# Patient Record
Sex: Female | Born: 1965 | Race: Black or African American | Hispanic: No | Marital: Single | Smoking: Never smoker
Health system: Southern US, Community
[De-identification: ages and names within clinical notes are randomized; demographics above are authoritative.]

## PROBLEM LIST (undated history)

## (undated) DIAGNOSIS — Z8619 Personal history of other infectious and parasitic diseases: Secondary | ICD-10-CM

## (undated) DIAGNOSIS — G4730 Sleep apnea, unspecified: Secondary | ICD-10-CM

## (undated) DIAGNOSIS — F3281 Premenstrual dysphoric disorder: Secondary | ICD-10-CM

## (undated) DIAGNOSIS — F329 Major depressive disorder, single episode, unspecified: Secondary | ICD-10-CM

## (undated) DIAGNOSIS — F32A Depression, unspecified: Secondary | ICD-10-CM

## (undated) HISTORY — PX: FOOT SURGERY: SHX648

## (undated) HISTORY — DX: Premenstrual dysphoric disorder: F32.81

## (undated) HISTORY — DX: Personal history of other infectious and parasitic diseases: Z86.19

## (undated) HISTORY — PX: WISDOM TOOTH EXTRACTION: SHX21

## (undated) HISTORY — DX: Major depressive disorder, single episode, unspecified: F32.9

## (undated) HISTORY — DX: Sleep apnea, unspecified: G47.30

## (undated) HISTORY — DX: Depression, unspecified: F32.A

---

## 1997-03-31 ENCOUNTER — Other Ambulatory Visit: Admission: RE | Admit: 1997-03-31 | Discharge: 1997-03-31 | Payer: Self-pay | Admitting: Obstetrics and Gynecology

## 1997-07-18 ENCOUNTER — Emergency Department (HOSPITAL_COMMUNITY): Admission: EM | Admit: 1997-07-18 | Discharge: 1997-07-18 | Payer: Self-pay | Admitting: Emergency Medicine

## 1998-04-20 ENCOUNTER — Other Ambulatory Visit: Admission: RE | Admit: 1998-04-20 | Discharge: 1998-04-20 | Payer: Self-pay | Admitting: Obstetrics and Gynecology

## 1999-06-01 ENCOUNTER — Other Ambulatory Visit: Admission: RE | Admit: 1999-06-01 | Discharge: 1999-06-01 | Payer: Self-pay | Admitting: Obstetrics & Gynecology

## 2000-06-04 ENCOUNTER — Other Ambulatory Visit: Admission: RE | Admit: 2000-06-04 | Discharge: 2000-06-04 | Payer: Self-pay | Admitting: Obstetrics and Gynecology

## 2001-08-26 ENCOUNTER — Other Ambulatory Visit: Admission: RE | Admit: 2001-08-26 | Discharge: 2001-08-26 | Payer: Self-pay | Admitting: Obstetrics and Gynecology

## 2001-09-14 ENCOUNTER — Encounter: Payer: Self-pay | Admitting: Obstetrics and Gynecology

## 2001-09-14 ENCOUNTER — Ambulatory Visit (HOSPITAL_COMMUNITY): Admission: RE | Admit: 2001-09-14 | Discharge: 2001-09-14 | Payer: Self-pay | Admitting: Obstetrics and Gynecology

## 2002-09-02 ENCOUNTER — Other Ambulatory Visit: Admission: RE | Admit: 2002-09-02 | Discharge: 2002-09-02 | Payer: Self-pay | Admitting: Obstetrics and Gynecology

## 2002-09-27 ENCOUNTER — Ambulatory Visit (HOSPITAL_COMMUNITY): Admission: RE | Admit: 2002-09-27 | Discharge: 2002-09-27 | Payer: Self-pay | Admitting: Obstetrics and Gynecology

## 2002-09-27 ENCOUNTER — Encounter: Payer: Self-pay | Admitting: Obstetrics and Gynecology

## 2004-05-21 ENCOUNTER — Other Ambulatory Visit: Admission: RE | Admit: 2004-05-21 | Discharge: 2004-05-21 | Payer: Self-pay | Admitting: Obstetrics and Gynecology

## 2005-05-23 ENCOUNTER — Other Ambulatory Visit: Admission: RE | Admit: 2005-05-23 | Discharge: 2005-05-23 | Payer: Self-pay | Admitting: Obstetrics and Gynecology

## 2005-09-13 ENCOUNTER — Other Ambulatory Visit: Admission: RE | Admit: 2005-09-13 | Discharge: 2005-09-13 | Payer: Self-pay | Admitting: Obstetrics and Gynecology

## 2010-01-28 ENCOUNTER — Encounter: Payer: Self-pay | Admitting: Obstetrics and Gynecology

## 2010-08-07 ENCOUNTER — Other Ambulatory Visit: Payer: Self-pay | Admitting: Obstetrics and Gynecology

## 2010-08-07 DIAGNOSIS — Z1231 Encounter for screening mammogram for malignant neoplasm of breast: Secondary | ICD-10-CM

## 2010-08-17 ENCOUNTER — Ambulatory Visit
Admission: RE | Admit: 2010-08-17 | Discharge: 2010-08-17 | Disposition: A | Discharge status: Home / Self Care | Payer: BC Managed Care – PPO | Source: Ambulatory Visit | Attending: Obstetrics and Gynecology | Admitting: Obstetrics and Gynecology

## 2010-08-17 DIAGNOSIS — Z1231 Encounter for screening mammogram for malignant neoplasm of breast: Secondary | ICD-10-CM

## 2011-05-23 ENCOUNTER — Encounter: Payer: Self-pay | Admitting: Obstetrics and Gynecology

## 2011-05-23 DIAGNOSIS — F3281 Premenstrual dysphoric disorder: Secondary | ICD-10-CM | POA: Insufficient documentation

## 2011-05-23 DIAGNOSIS — B379 Candidiasis, unspecified: Secondary | ICD-10-CM | POA: Insufficient documentation

## 2011-05-23 DIAGNOSIS — N943 Premenstrual tension syndrome: Secondary | ICD-10-CM | POA: Insufficient documentation

## 2011-05-24 ENCOUNTER — Ambulatory Visit (INDEPENDENT_AMBULATORY_CARE_PROVIDER_SITE_OTHER): Payer: BC Managed Care – PPO | Admitting: Obstetrics and Gynecology

## 2011-05-24 ENCOUNTER — Encounter: Payer: Self-pay | Admitting: Obstetrics and Gynecology

## 2011-05-24 VITALS — BP 110/72 | Temp 98.7°F | Ht 65.0 in | Wt 187.0 lb

## 2011-05-24 DIAGNOSIS — IMO0001 Reserved for inherently not codable concepts without codable children: Secondary | ICD-10-CM

## 2011-05-24 DIAGNOSIS — R102 Pelvic and perineal pain: Secondary | ICD-10-CM

## 2011-05-24 DIAGNOSIS — Z309 Encounter for contraceptive management, unspecified: Secondary | ICD-10-CM

## 2011-05-24 DIAGNOSIS — N949 Unspecified condition associated with female genital organs and menstrual cycle: Secondary | ICD-10-CM

## 2011-05-24 LAB — POCT URINALYSIS DIPSTICK
Bilirubin, UA: NEGATIVE
Ketones, UA: NEGATIVE
Leukocytes, UA: NEGATIVE
Protein, UA: NEGATIVE
Spec Grav, UA: <=1.005

## 2011-05-24 MED ORDER — NORGESTIMATE-ETH ESTRADIOL 0.25-35 MG-MCG PO TABS: 1.0000 | ORAL_TABLET | Freq: Every day | ORAL | Status: DC

## 2011-05-24 NOTE — Progress Notes (Signed)
46 YO complaining of dull nagging pelvic  pain that becomes intense at times even without her period.  Intense times pain is rated  5/10 but relieved with Ibuprofen 600mg  .  This has occurred several times a week for the past x several months. With her  period, patient's pain is a  7/10 but also responsive to Ibuprofen.  Menses lasts 3 days, is very light and on occasion will skip a period.  O: Pelvic:  EGBUS-wnl, vagina-scant pink discharge, cervix-no lesions, uterus-retroverted, tender on left but no palpable masses, adnexae-no masses but left tenderness  UPT-negative U/A-negative  A: New Pelvic Pain  P: Sprintec #1 1 po qd 11 rf begin with next cycle       Reviewed causes of pelvic pain: GI. GU, surgery &     musculoskeletal      Pelvic U/S-r/o pelvic masses  Annarae Macnair, PA-C

## 2011-05-24 NOTE — Progress Notes (Signed)
Vaginal Discharge: no Kidney Stones: no Prior Eval: no  Odor: no Constipation: no Prior U/S: no  Fever: no Diarrhea: no Hx of Ovarian Cyst: yes  Irreg. Periods: no Rectal Bleeding: no Hx of STD-PID: yes  Dyspareunia: no Vomiting: no Appendectomy: no   Dysuria: no Nausea: no Gall Bladder Ds: no  Frequency: no Pregnant: no Other: PT WOULD LIKE A REFILL ON BC; PT WILL LOSE HER JOB BEFORE AEX  Urgency: no Fibroids: no   Hematuria: no Endometriosis: no

## 2011-06-04 ENCOUNTER — Ambulatory Visit (INDEPENDENT_AMBULATORY_CARE_PROVIDER_SITE_OTHER): Payer: BC Managed Care – PPO

## 2011-06-04 ENCOUNTER — Encounter: Payer: Self-pay | Admitting: Obstetrics and Gynecology

## 2011-06-04 ENCOUNTER — Ambulatory Visit (INDEPENDENT_AMBULATORY_CARE_PROVIDER_SITE_OTHER): Payer: BC Managed Care – PPO | Admitting: Obstetrics and Gynecology

## 2011-06-04 ENCOUNTER — Other Ambulatory Visit: Payer: Self-pay | Admitting: Obstetrics and Gynecology

## 2011-06-04 VITALS — BP 104/70 | HR 70 | Ht 65.0 in | Wt 188.0 lb

## 2011-06-04 DIAGNOSIS — F329 Major depressive disorder, single episode, unspecified: Secondary | ICD-10-CM | POA: Insufficient documentation

## 2011-06-04 DIAGNOSIS — R102 Pelvic and perineal pain: Secondary | ICD-10-CM

## 2011-06-04 DIAGNOSIS — Z8619 Personal history of other infectious and parasitic diseases: Secondary | ICD-10-CM | POA: Insufficient documentation

## 2011-06-04 DIAGNOSIS — N949 Unspecified condition associated with female genital organs and menstrual cycle: Secondary | ICD-10-CM

## 2011-06-04 DIAGNOSIS — G4730 Sleep apnea, unspecified: Secondary | ICD-10-CM

## 2011-06-04 DIAGNOSIS — N83209 Unspecified ovarian cyst, unspecified side: Secondary | ICD-10-CM

## 2011-06-04 NOTE — Progress Notes (Signed)
46 YO seen last week with several month history of pelvic pain that is intense but relieved with Ibuprofen 600 mg.  Occurs with and without menses.   O: U/S uterus-wnl;  # 3 fibroids < 2 cm; normal appearing right ovary;  left ovary without well defined borders and fills the adnexa; questionable thickened tube adjacent to left ovary vs complex ovarian tissue   A: Pelvic Pain     Left Adnexal Mass  P: Repeat U/S in 5 weeks (patient's insurance runs out)      Call for any increase in symptoms-torsion precautions      Continue Ibuprofen as directed with  food for pain      U/S to Dr. Normand Sloop for review and any further recommenda-     tions  Elanda Garmany, PA-C

## 2011-06-28 ENCOUNTER — Other Ambulatory Visit: Payer: Self-pay | Admitting: Obstetrics and Gynecology

## 2011-06-28 DIAGNOSIS — D219 Benign neoplasm of connective and other soft tissue, unspecified: Secondary | ICD-10-CM

## 2011-06-28 DIAGNOSIS — R102 Pelvic and perineal pain: Secondary | ICD-10-CM

## 2011-07-01 ENCOUNTER — Other Ambulatory Visit: Payer: Self-pay | Admitting: Obstetrics and Gynecology

## 2011-07-01 ENCOUNTER — Encounter: Payer: Self-pay | Admitting: Obstetrics and Gynecology

## 2011-07-01 ENCOUNTER — Ambulatory Visit (INDEPENDENT_AMBULATORY_CARE_PROVIDER_SITE_OTHER): Payer: BC Managed Care – PPO | Admitting: Obstetrics and Gynecology

## 2011-07-01 ENCOUNTER — Ambulatory Visit (INDEPENDENT_AMBULATORY_CARE_PROVIDER_SITE_OTHER): Payer: BC Managed Care – PPO

## 2011-07-01 VITALS — BP 112/64 | Temp 99.0°F | Ht 65.0 in | Wt 185.0 lb

## 2011-07-01 DIAGNOSIS — N949 Unspecified condition associated with female genital organs and menstrual cycle: Secondary | ICD-10-CM

## 2011-07-01 DIAGNOSIS — R102 Pelvic and perineal pain: Secondary | ICD-10-CM

## 2011-07-01 DIAGNOSIS — D259 Leiomyoma of uterus, unspecified: Secondary | ICD-10-CM

## 2011-07-01 DIAGNOSIS — D219 Benign neoplasm of connective and other soft tissue, unspecified: Secondary | ICD-10-CM

## 2011-07-01 DIAGNOSIS — N7011 Chronic salpingitis: Secondary | ICD-10-CM

## 2011-07-01 DIAGNOSIS — N7013 Chronic salpingitis and oophoritis: Secondary | ICD-10-CM

## 2011-07-01 NOTE — Progress Notes (Addendum)
46 YO with pelvic pain and previous ultrasound with a left adnexal mass (? left hydrosalpinx)  returns for repeat ultrasound. Aware of discomfort daily but during menses it becomes actuals pain rated at  a 4/10 and with intercourse (non-positional) will increase to 2/10 and linger x 1 day. Last menstrual period was 06/18/11.  Flow is only for 3 days, is very light and has cramps as described above.  This problem has been noticed for the past 8 months.   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 Estanislado Pandy recommendations for further     evaluation     Patient's job and insurance will end July 1st and if possible    would like to wait until she's employed with benefits again    if an invasive procedure is recommended.     RTO: TBA or prn  Kristy Brindle, PA-C

## 2011-07-01 NOTE — Patient Instructions (Signed)
hydrosalpinx

## 2011-07-01 NOTE — Progress Notes (Signed)
Vag. Discharge:no Odor:no Fever:no Irreg.Periods:yes Dyspareunia:yes Dysuria:no Frequency:no Urgency:no Hematuria:no Kidney stones:no Constipation:no Diarrhea:no Rectal Bleeding: no Vomiting:no Nausea:no Pregnant:no Fibroids:yes Endometriosis:no Hx of Ovarian Cyst:yes Hx IUD:no Hx STD-PID:yes Appendectomy:no Gall Bladder Dz:no

## 2013-06-28 ENCOUNTER — Other Ambulatory Visit: Payer: Self-pay | Admitting: Obstetrics and Gynecology

## 2013-06-28 DIAGNOSIS — Z1231 Encounter for screening mammogram for malignant neoplasm of breast: Secondary | ICD-10-CM

## 2013-07-23 ENCOUNTER — Ambulatory Visit
Admission: RE | Admit: 2013-07-23 | Discharge: 2013-07-23 | Disposition: A | Discharge status: Home / Self Care | Payer: 59 | Source: Ambulatory Visit | Attending: Obstetrics and Gynecology | Admitting: Obstetrics and Gynecology

## 2013-07-23 ENCOUNTER — Encounter (INDEPENDENT_AMBULATORY_CARE_PROVIDER_SITE_OTHER): Payer: Self-pay

## 2013-07-23 DIAGNOSIS — Z1231 Encounter for screening mammogram for malignant neoplasm of breast: Secondary | ICD-10-CM

## 2013-11-08 ENCOUNTER — Encounter: Payer: Self-pay | Admitting: Obstetrics and Gynecology

## 2014-06-20 ENCOUNTER — Ambulatory Visit
Admission: RE | Admit: 2014-06-20 | Discharge: 2014-06-20 | Disposition: A | Discharge status: Home / Self Care | Payer: 59 | Source: Ambulatory Visit | Attending: Family Medicine | Admitting: Family Medicine

## 2014-06-20 ENCOUNTER — Other Ambulatory Visit: Payer: Self-pay | Admitting: Family Medicine

## 2014-06-20 DIAGNOSIS — R059 Cough, unspecified: Secondary | ICD-10-CM

## 2014-06-20 DIAGNOSIS — R05 Cough: Secondary | ICD-10-CM

## 2015-12-22 ENCOUNTER — Ambulatory Visit (INDEPENDENT_AMBULATORY_CARE_PROVIDER_SITE_OTHER): Payer: 59

## 2015-12-22 ENCOUNTER — Ambulatory Visit (INDEPENDENT_AMBULATORY_CARE_PROVIDER_SITE_OTHER): Payer: 59 | Admitting: Sports Medicine

## 2015-12-22 ENCOUNTER — Encounter: Payer: Self-pay | Admitting: Sports Medicine

## 2015-12-22 DIAGNOSIS — M79672 Pain in left foot: Secondary | ICD-10-CM | POA: Diagnosis not present

## 2015-12-22 DIAGNOSIS — M79671 Pain in right foot: Secondary | ICD-10-CM | POA: Diagnosis not present

## 2015-12-22 DIAGNOSIS — M722 Plantar fascial fibromatosis: Secondary | ICD-10-CM

## 2015-12-22 MED ORDER — TRIAMCINOLONE ACETONIDE 10 MG/ML IJ SUSP: 10.0000 mg | Freq: Once | INTRAMUSCULAR | Status: AC

## 2015-12-22 MED ORDER — METHYLPREDNISOLONE 4 MG PO TBPK
ORAL_TABLET | ORAL | 0 refills | Status: DC
Start: 2015-12-22 — End: 2018-06-24

## 2015-12-22 MED ORDER — DICLOFENAC SODIUM 75 MG PO TBEC: 75.0000 mg | DELAYED_RELEASE_TABLET | Freq: Two times a day (BID) | ORAL | 0 refills | Status: DC

## 2015-12-22 NOTE — Progress Notes (Signed)
Subjective: Kristy Fernandez is a 50 y.o. female patient presents to office with complaint of heel pain on the right greater than left. 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 Patient has treated this problem with orthotics, stretching, icing, changing shoes, however, her pain exacerbated by work, works as a Scientist, product/process development. Denies any other pedal complaints.   Patient Active Problem List   Diagnosis Date Noted  . 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 History of HPV infection   . Depression   . Observed sleep apnea   . Yeast infection 05/23/2011  . PMS (premenstrual syndrome) 05/23/2011  . PMDD (premenstrual dysphoric disorder) 05/23/2011    Current Outpatient Prescriptions on File Prior to Visit  Medication Sig Dispense Refill  . citalopram (CELEXA) 20 MG tablet Take 20 mg by mouth daily.    . norethindrone-ethinyl estradiol (NECON,BREVICON,MODICON) 0.5-35 MG-MCG tablet Take 1 tablet by mouth daily.    . norgestimate-ethinyl estradiol (SPRINTEC 28) 0.25-35 MG-MCG tablet Take 1 tablet by mouth daily. 1 Package 11  . temazepam (RESTORIL) 15 MG capsule Take 15 mg by mouth at bedtime as needed.    . zolpidem (AMBIEN) 10 MG tablet Take 10 mg by mouth at bedtime as needed.     No current facility-administered medications on file prior to visit.     Allergies  Allergen Reactions  . Floxin [Ofloxacin] Itching and Rash  . Shellfish Allergy Itching and Shortness Of Breath  . Shellfish-Derived Products     Objective: Physical Exam General: The patient is alert and oriented x3 in no acute distress.  Dermatology: Skin is warm, dry and supple bilateral lower extremities. Nails 1-10 are normal. There is no erythema, edema, no eccymosis, no open lesions present. Integument is otherwise unremarkable.  Vascular: Dorsalis Pedis pulse and Posterior Tibial pulse are 2/4 bilateral. Capillary fill time is immediate to all digits.  Neurological:  Grossly intact to light touch with an achilles reflex of +2/5 and a negative Tinel's sign bilateral.  Musculoskeletal: Tenderness to palpation at the medial calcaneal tubercale and through the insertion of the plantar fascia on the left and right foot. No pain with compression of calcaneus bilateral. No pain with tuning fork to calcaneus bilateral. No pain with calf compression bilateral. There is decreased Ankle joint range of motion bilateral. All other joints range of motion within normal limits bilateral. Strength 5/5 in all groups bilateral. Pes planus foot type.  Xray, Right/Left foot:  Normal osseous mineralization. Joint spaces preserved except at midtarsal joint where there is breached, suggestive of pes planus. No fracture/dislocation/boney destruction. Calcaneal spur present with moderate thickening of plantar fascia. No other soft tissue abnormalities or radiopaque foreign bodies.   Assessment and Plan: Problem List Items Addressed This Visit    None    Visit Diagnoses    Plantar fasciitis    -  Primary   Relevant Medications   triamcinolone acetonide (KENALOG) 10 MG/ML injection 10 mg   methylPREDNISolone (MEDROL DOSEPAK) 4 MG TBPK tablet   diclofenac (VOLTAREN) 75 MG EC tablet   Pain in both feet       Relevant Medications   triamcinolone acetonide (KENALOG) 10 MG/ML injection 10 mg   methylPREDNISolone (MEDROL DOSEPAK) 4 MG TBPK tablet   diclofenac (VOLTAREN) 75 MG EC tablet   Other Relevant Orders   DG Foot 2 Views Left   DG Foot 2 Views Right      -Complete examination performed.  -Xrays reviewed -Discussed with  patient in detail the condition of plantar fasciitis, how this occurs and general treatment options. Explained both conservative and surgical treatments.  -After oral consent and aseptic prep, injected a mixture containing 1 ml of 2% plain lidocaine, 1 ml 0.5% plain marcaine, 0.5 ml of kenalog 10 and 0.5 ml of dexamethasone phosphate into left and right heel.  Post-injection care discussed with patient.  -Rx Diclofenac to start after Medrol dose pack is completed -Recommended good supportive shoes and advised use of custom molded orthoses. - Explained in detail the use of the fascial braces which were dispensed at today's visit. -Explained and dispensed to patient daily stretching exercises. -Recommend patient to ice affected area 1-2x daily. -Patient to return to office in 4 weeks for follow up or sooner if problems or questions arise.  Landis Martins, DPM

## 2015-12-22 NOTE — Patient Instructions (Signed)

## 2016-01-12 ENCOUNTER — Encounter: Payer: Self-pay | Admitting: Sports Medicine

## 2016-01-12 ENCOUNTER — Ambulatory Visit (INDEPENDENT_AMBULATORY_CARE_PROVIDER_SITE_OTHER): Payer: 59 | Admitting: Sports Medicine

## 2016-01-12 DIAGNOSIS — M722 Plantar fascial fibromatosis: Secondary | ICD-10-CM

## 2016-01-12 DIAGNOSIS — M79671 Pain in right foot: Secondary | ICD-10-CM

## 2016-01-12 DIAGNOSIS — M79672 Pain in left foot: Secondary | ICD-10-CM

## 2016-01-12 MED ORDER — DICLOFENAC SODIUM 75 MG PO TBEC: 75.0000 mg | DELAYED_RELEASE_TABLET | Freq: Two times a day (BID) | ORAL | 0 refills | Status: DC

## 2016-01-12 MED ORDER — TRIAMCINOLONE ACETONIDE 40 MG/ML IJ SUSP: 20.0000 mg | Freq: Once | INTRAMUSCULAR | Status: AC

## 2016-01-12 NOTE — Progress Notes (Signed)
Subjective: Kristy Fernandez is a 51 y.o. female returns to office for follow up evaluation after Left and Right heel injection for plantar fasciitis, injection #1 administered 4 weeks ago. Patient states that the injection seems to help her pain; now can walk and stand a little longer and do more exercise. However, at work, after about 8 hours of work, Her feet start to bother her. States that she has new work shoes which has helped some as well. Patient denies any recent changes in medications or new problems since last visit.   Patient Active Problem List   Diagnosis Date Noted  . 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 History of HPV infection   . Depression   . Observed sleep apnea   . Yeast infection 05/23/2011  . PMS (premenstrual syndrome) 05/23/2011  . PMDD (premenstrual dysphoric disorder) 05/23/2011    Current Outpatient Prescriptions on File Prior to Visit  Medication Sig Dispense Refill  . Albuterol Sulfate 108 (90 Base) MCG/ACT AEPB Inhale into the lungs.    Marland Kitchen amoxicillin (AMOXIL) 875 MG tablet     . citalopram (CELEXA) 20 MG tablet Take 20 mg by mouth daily.    . diclofenac (VOLTAREN) 75 MG EC tablet Take 1 tablet (75 mg total) by mouth 2 (two) times daily. 30 tablet 0  . fexofenadine (ALLEGRA) 180 MG tablet Take by mouth.    . fluticasone (FLONASE) 50 MCG/ACT nasal spray Place into the nose.    Marland Kitchen HYDROcodone-homatropine (HYCODAN) 5-1.5 MG/5ML syrup     . levonorgestrel-ethinyl estradiol (AVIANE,ALESSE,LESSINA) 0.1-20 MG-MCG tablet     . methylPREDNISolone (MEDROL DOSEPAK) 4 MG TBPK tablet Take as instructed 21 tablet 0  . MICROGESTIN FE 1.5/30 1.5-30 MG-MCG tablet     . norethindrone-ethinyl estradiol (NECON,BREVICON,MODICON) 0.5-35 MG-MCG tablet Take 1 tablet by mouth daily.    . norgestimate-ethinyl estradiol (SPRINTEC 28) 0.25-35 MG-MCG tablet Take 1 tablet by mouth daily. 1 Package 11  . temazepam (RESTORIL) 15 MG capsule Take 15 mg by mouth at bedtime as needed.    . zolpidem  (AMBIEN) 10 MG tablet Take 10 mg by mouth at bedtime as needed.     Current Facility-Administered Medications on File Prior to Visit  Medication Dose Route Frequency Provider Last Rate Last Dose  . triamcinolone acetonide (KENALOG) 10 MG/ML injection 10 mg  10 mg Other Once Landis Martins, DPM        Allergies  Allergen Reactions  . Floxin [Ofloxacin] Itching and Rash  . Shellfish Allergy Itching and Shortness Of Breath  . Shellfish-Derived Products     Objective:   General:  Alert and oriented x 3, in no acute distress  Dermatology: Skin is warm, dry, and supple bilateral. Nails are within normal limits. There is no lower extremity erythema, no eccymosis, no open lesions present bilateral.   Vascular: Dorsalis Pedis and Posterior Tibial pedal pulses are 2/4 bilateral. + hair growth noted bilateral. Capillary Fill Time is 3 seconds in all digits. No varicosities, No edema bilateral lower extremities.   Neurological: Sensation grossly intact to light touch with an achilles reflex of +2 and a negative Tinel's sign bilateral. Vibratory, sharp/dull, Semmes Weinstein Monofilament within normal limits.   Musculoskeletal: There is decreased tenderness to palpation at the medial calcaneal tubercale and through the insertion of the plantar fascia on the Left<right foot. No pain with compression to calcaneus or application of tuning fork. There is decreased Ankle joint range of motion bilateral. All other jointsrange of motion  within normal limits bilateral.  Strength 5/5 bilateral. Pes planus foot type bilateral.  Assessment and Plan: Problem List Items Addressed This Visit    None    Visit Diagnoses    Plantar fasciitis    -  Primary   Relevant Medications   diclofenac (VOLTAREN) 75 MG EC tablet   triamcinolone acetonide (KENALOG-40) injection 20 mg   Pain in both feet       Relevant Medications   diclofenac (VOLTAREN) 75 MG EC tablet   triamcinolone acetonide (KENALOG-40) injection 20  mg     -Complete examination performed.  -Previous x-rays reviewed. -Discussed with patient in detail the condition of plantar fasciitis, how this  occurs related to the foot type of the patient and general treatment options. - Patient opted for another injection today; After oral consent and aseptic prep, injected a mixture containing 1 ml of 1%plain lidocaine, 1 ml 0.5% plain marcaine, 0.5 ml of kenalog 40 and 0.5 ml of dexmethasone phosphate to left<right heel at area of most pain/trigger point injection. -Refilled diclofenac -Continue with plantar fascial braces, bilateral, stretching, icing, good supportive shoes, inserts daily.  -Discussed long term care and reocurrence; will closely monitor; if fails to improve will consider other treatment modalities.  -Patient to return to office in 1 month for follow up or sooner if problems or questions arise.  Landis Martins, DPM

## 2016-02-02 ENCOUNTER — Encounter: Payer: Self-pay | Admitting: Sports Medicine

## 2016-02-02 ENCOUNTER — Ambulatory Visit (INDEPENDENT_AMBULATORY_CARE_PROVIDER_SITE_OTHER): Payer: 59 | Admitting: Sports Medicine

## 2016-02-02 DIAGNOSIS — M79672 Pain in left foot: Secondary | ICD-10-CM

## 2016-02-02 DIAGNOSIS — M79671 Pain in right foot: Secondary | ICD-10-CM

## 2016-02-02 DIAGNOSIS — M722 Plantar fascial fibromatosis: Secondary | ICD-10-CM | POA: Diagnosis not present

## 2016-02-02 MED ORDER — TRIAMCINOLONE ACETONIDE 40 MG/ML IJ SUSP: 20.0000 mg | Freq: Once | INTRAMUSCULAR | Status: AC

## 2016-02-02 NOTE — Progress Notes (Signed)
Subjective: Kristy Fernandez is a 51 y.o. female returns to office for follow up evaluation after Left and Right heel injection for plantar fasciitis, injection #2 administered 4 weeks ago. Patient states that the injection seems to help her pain, however, still after about 8 hours of work, Her feet start to bother her. States that she feels like she is walking on the bone spur. Patient denies any recent changes in medications or new problems since last visit.   Patient Active Problem List   Diagnosis Date Noted  . 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 History of HPV infection   . Depression   . Observed sleep apnea   . Yeast infection 05/23/2011  . PMS (premenstrual syndrome) 05/23/2011  . PMDD (premenstrual dysphoric disorder) 05/23/2011    Current Outpatient Prescriptions on File Prior to Visit  Medication Sig Dispense Refill  . Albuterol Sulfate 108 (90 Base) MCG/ACT AEPB Inhale into the lungs.    Marland Kitchen amoxicillin (AMOXIL) 875 MG tablet     . citalopram (CELEXA) 20 MG tablet Take 20 mg by mouth daily.    . diclofenac (VOLTAREN) 75 MG EC tablet Take 1 tablet (75 mg total) by mouth 2 (two) times daily. 60 tablet 0  . fexofenadine (ALLEGRA) 180 MG tablet Take by mouth.    . fluticasone (FLONASE) 50 MCG/ACT nasal spray Place into the nose.    Marland Kitchen HYDROcodone-homatropine (HYCODAN) 5-1.5 MG/5ML syrup     . levonorgestrel-ethinyl estradiol (AVIANE,ALESSE,LESSINA) 0.1-20 MG-MCG tablet     . methylPREDNISolone (MEDROL DOSEPAK) 4 MG TBPK tablet Take as instructed 21 tablet 0  . MICROGESTIN FE 1.5/30 1.5-30 MG-MCG tablet     . norethindrone-ethinyl estradiol (NECON,BREVICON,MODICON) 0.5-35 MG-MCG tablet Take 1 tablet by mouth daily.    . norgestimate-ethinyl estradiol (SPRINTEC 28) 0.25-35 MG-MCG tablet Take 1 tablet by mouth daily. 1 Package 11  . temazepam (RESTORIL) 15 MG capsule Take 15 mg by mouth at bedtime as needed.    . zolpidem (AMBIEN) 10 MG tablet Take 10 mg by mouth at bedtime as needed.      Current Facility-Administered Medications on File Prior to Visit  Medication Dose Route Frequency Provider Last Rate Last Dose  . triamcinolone acetonide (KENALOG) 10 MG/ML injection 10 mg  10 mg Other Once [employer], DPM      . triamcinolone acetonide (KENALOG-40) injection 20 mg  20 mg Other Once Landis Martins, DPM        Allergies  Allergen Reactions  . Floxin [Ofloxacin] Itching and Rash  . Shellfish Allergy Itching and Shortness Of Breath  . Shellfish-Derived Products     Objective:   General:  Alert and oriented x 3, in no acute distress  Dermatology: Skin is warm, dry, and supple bilateral. Nails are within normal limits. There is no lower extremity erythema, no eccymosis, no open lesions present bilateral.   Vascular: Dorsalis Pedis and Posterior Tibial pedal pulses are 2/4 bilateral. + hair growth noted bilateral. Capillary Fill Time is 3 seconds in all digits. No varicosities, No edema bilateral lower extremities.   Neurological: Sensation grossly intact to light touch with an achilles reflex of +2 and a negative Tinel's sign bilateral. Vibratory, sharp/dull, Semmes Weinstein Monofilament within normal limits.   Musculoskeletal: There is decreased tenderness to palpation at the medial calcaneal tubercale and through the insertion of the plantar fascia on the Left<right foot. No pain with compression to calcaneus or application of tuning fork. There is decreased Ankle joint range of motion bilateral. All other jointsrange of  motion  within normal limits bilateral. Strength 5/5 bilateral. Pes planus foot type bilateral.  Assessment and Plan: Problem List Items Addressed This Visit    None    Visit Diagnoses    Plantar fasciitis    -  Primary   Relevant Medications   triamcinolone acetonide (KENALOG-40) injection 20 mg   Pain in both feet       Relevant Medications   triamcinolone acetonide (KENALOG-40) injection 20 mg     -Complete examination performed.   -Previous x-rays reviewed. -Discussed with patient in detail the condition of plantar fasciitis, how this occurs related to the foot type of the patient and general treatment options. -Patient declined physical therapy. States that she does not want to pay out of pocket for this treatment. Right now, May consider in the future. - Patient opted for another injection today; After oral consent and aseptic prep, injected a mixture containing 1 ml of 1%plain lidocaine, 1 ml 0.5% plain marcaine, 0.5 ml of kenalog 40 and 0.5 ml of dexmethasone phosphate to left<right heel at area of most pain/trigger point injection. This is injection #3. -Continue with diclofenac until completed. -Continue with plantar fascial braces, bilateral, stretching, icing, good supportive shoes, inserts daily. Encouraged patient to be compliant with icing and stretching as previously instructed. -Discussed long term care and reocurrence; will closely monitor; if fails to improve will consider other treatment modalities.  -Patient to return to office as needed for follow up or sooner if problems or questions arise.  Landis Martins, DPM

## 2016-02-16 ENCOUNTER — Ambulatory Visit: Payer: 59 | Admitting: Sports Medicine

## 2016-06-21 DIAGNOSIS — L669 Cicatricial alopecia, unspecified: Secondary | ICD-10-CM | POA: Insufficient documentation

## 2016-08-14 ENCOUNTER — Ambulatory Visit (INDEPENDENT_AMBULATORY_CARE_PROVIDER_SITE_OTHER): Payer: 59 | Admitting: Sports Medicine

## 2016-08-14 DIAGNOSIS — M79672 Pain in left foot: Secondary | ICD-10-CM

## 2016-08-14 DIAGNOSIS — M79671 Pain in right foot: Secondary | ICD-10-CM

## 2016-08-14 DIAGNOSIS — M722 Plantar fascial fibromatosis: Secondary | ICD-10-CM

## 2016-08-14 MED ORDER — TRIAMCINOLONE ACETONIDE 10 MG/ML IJ SUSP: 10.0000 mg | Freq: Once | INTRAMUSCULAR | Status: AC

## 2016-08-14 MED ORDER — DICLOFENAC SODIUM 75 MG PO TBEC: 75.0000 mg | DELAYED_RELEASE_TABLET | Freq: Two times a day (BID) | ORAL | 0 refills | Status: DC

## 2016-08-14 NOTE — Progress Notes (Signed)
Subjective: Kristy Fernandez is a 51 y.o. female returns to office for follow up evaluation of Left and Right heel pain reports that she has been doing good but about 2 weeks ago started to have pain again especially with trying to work and workout. States that she wants a medication refill and injections. Patient denies any recent changes in medications or new problems since last visit.   Patient Active Problem List   Diagnosis Date Noted  . 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 History of HPV infection   . Depression   . Observed sleep apnea   . Yeast infection 05/23/2011  . PMS (premenstrual syndrome) 05/23/2011  . PMDD (premenstrual dysphoric disorder) 05/23/2011    Current Outpatient Prescriptions on File Prior to Visit  Medication Sig Dispense Refill  . Albuterol Sulfate 108 (90 Base) MCG/ACT AEPB Inhale into the lungs.    Marland Kitchen amoxicillin (AMOXIL) 875 MG tablet     . citalopram (CELEXA) 20 MG tablet Take 20 mg by mouth daily.    . fexofenadine (ALLEGRA) 180 MG tablet Take by mouth.    . fluticasone (FLONASE) 50 MCG/ACT nasal spray Place into the nose.    Marland Kitchen HYDROcodone-homatropine (HYCODAN) 5-1.5 MG/5ML syrup     . levonorgestrel-ethinyl estradiol (AVIANE,ALESSE,LESSINA) 0.1-20 MG-MCG tablet     . methylPREDNISolone (MEDROL DOSEPAK) 4 MG TBPK tablet Take as instructed 21 tablet 0  . MICROGESTIN FE 1.5/30 1.5-30 MG-MCG tablet     . norethindrone-ethinyl estradiol (NECON,BREVICON,MODICON) 0.5-35 MG-MCG tablet Take 1 tablet by mouth daily.    . norgestimate-ethinyl estradiol (SPRINTEC 28) 0.25-35 MG-MCG tablet Take 1 tablet by mouth daily. 1 Package 11  . temazepam (RESTORIL) 15 MG capsule Take 15 mg by mouth at bedtime as needed.    . zolpidem (AMBIEN) 10 MG tablet Take 10 mg by mouth at bedtime as needed.     Current Facility-Administered Medications on File Prior to Visit  Medication Dose Route Frequency Provider Last Rate Last Dose  . triamcinolone acetonide (KENALOG) 10 MG/ML injection 10 mg   10 mg Other Once [place], Evellyn Tuff, DPM      . triamcinolone acetonide (KENALOG-40) injection 20 mg  20 mg Other Once [place], Khaleb Broz, DPM      . triamcinolone acetonide (KENALOG-40) injection 20 mg  20 mg Other Once Landis Martins, DPM        Allergies  Allergen Reactions  . Floxin [Ofloxacin] Itching and Rash  . Shellfish Allergy Itching and Shortness Of Breath  . Shellfish-Derived Products     Objective:   General:  Alert and oriented x 3, in no acute distress  Dermatology: Skin is warm, dry, and supple bilateral. Nails are within normal limits. There is no lower extremity erythema, no eccymosis, no open lesions present bilateral.   Vascular: Dorsalis Pedis and Posterior Tibial pedal pulses are 2/4 bilateral. + hair growth noted bilateral. Capillary Fill Time is 3 seconds in all digits. No varicosities, No edema bilateral lower extremities.   Neurological: Sensation grossly intact to light touch with an achilles reflex of +2 and a negative Tinel's sign bilateral. Vibratory, sharp/dull, Semmes Weinstein Monofilament within normal limits.   Musculoskeletal: There is decreased tenderness to palpation at the medial calcaneal tubercale and through the insertion of the plantar fascia on the Left<right foot. No pain with compression to calcaneus or application of tuning fork. There is decreased Ankle joint range of motion bilateral. All other jointsrange of motion  within normal limits bilateral. Strength 5/5 bilateral. Pes planus foot type bilateral.  Assessment and Plan: Problem List Items Addressed This Visit    None    Visit Diagnoses    Pain in both feet       Relevant Medications   diclofenac (VOLTAREN) 75 MG EC tablet   Plantar fasciitis       Relevant Medications   diclofenac (VOLTAREN) 75 MG EC tablet   triamcinolone acetonide (KENALOG) 10 MG/ML injection 10 mg (Start on 08/14/2016  7:15 PM)     -Complete examination performed.  -Previous x-rays reviewed. -Re-Discussed with  patient in detail the condition of plantar fasciitis, how this occurs related to the foot type of the patient and general treatment options.. - Patient opted for another injection today; After oral consent and aseptic prep, injected a mixture containing 1 ml of 1%plain lidocaine, 1 ml 0.5% plain marcaine, 0.5 ml of kenalog 10 and 0.5 ml of dexmethasone phosphate to left<right heel at area of most pain/trigger point injection. -Refilled diclofenac -Continue with night splint, plantar fascial braces, bilateral, stretching, icing, good supportive shoes, inserts daily. Encouraged patient to be compliant with icing and stretching as previously instructed. Especially stretching before and after working out -Discussed long term care and reocurrence; will closely monitor; if fails to improve will consider other treatment modalities.  -Patient to return to office as needed for follow up or sooner if problems or questions arise. If recurs may consider injection with taping and bloodwork/inflammatory panel.  Landis Martins, DPM

## 2016-09-12 ENCOUNTER — Telehealth: Payer: Self-pay | Admitting: *Deleted

## 2016-09-12 DIAGNOSIS — M722 Plantar fascial fibromatosis: Secondary | ICD-10-CM

## 2016-09-12 DIAGNOSIS — M79671 Pain in right foot: Secondary | ICD-10-CM

## 2016-09-12 DIAGNOSIS — M79672 Pain in left foot: Principal | ICD-10-CM

## 2016-09-12 MED ORDER — DICLOFENAC SODIUM 75 MG PO TBEC: 75.0000 mg | DELAYED_RELEASE_TABLET | Freq: Two times a day (BID) | ORAL | 0 refills | Status: DC

## 2016-09-12 NOTE — Telephone Encounter (Signed)
Refill request for diclofenac. Dr. Cannon Kettle ordered refill, pt needs an appt prior to future refills.

## 2016-12-13 ENCOUNTER — Encounter: Payer: Self-pay | Admitting: Sports Medicine

## 2016-12-13 ENCOUNTER — Ambulatory Visit (INDEPENDENT_AMBULATORY_CARE_PROVIDER_SITE_OTHER): Payer: 59 | Admitting: Sports Medicine

## 2016-12-13 DIAGNOSIS — M79673 Pain in unspecified foot: Secondary | ICD-10-CM

## 2016-12-13 DIAGNOSIS — T148XXA Other injury of unspecified body region, initial encounter: Secondary | ICD-10-CM

## 2016-12-13 DIAGNOSIS — M722 Plantar fascial fibromatosis: Secondary | ICD-10-CM | POA: Diagnosis not present

## 2016-12-13 DIAGNOSIS — M79671 Pain in right foot: Secondary | ICD-10-CM

## 2016-12-13 DIAGNOSIS — M79672 Pain in left foot: Secondary | ICD-10-CM

## 2016-12-13 MED ORDER — DICLOFENAC SODIUM 75 MG PO TBEC: 75.0000 mg | DELAYED_RELEASE_TABLET | Freq: Two times a day (BID) | ORAL | 2 refills | Status: DC

## 2016-12-13 NOTE — Patient Instructions (Signed)

## 2016-12-13 NOTE — Progress Notes (Signed)
Subjective: Kristy Fernandez is a 51 y.o. female returns to office for follow up evaluation of Left and Right heel pain reports that she went out of the country to [place] on a trip for 8 days and experienced increased pain with walking after about day 4 of constantly being on her feet patient states that pain is currently about a 6 out of 10 to both heels, states that the heel pain is about the same as previous no new sensations to pain that differs from other times she has presented with the same concerns.  Patient also states that she has noticed a spot underneath her nail on the left first toe and want to have this checked patient also requests a refill on diclofenac and denies any other symptoms at this time.  Patient Active Problem List   Diagnosis Date Noted  . 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 History of HPV infection   . Depression   . Observed sleep apnea   . Yeast infection 05/23/2011  . PMS (premenstrual syndrome) 05/23/2011  . PMDD (premenstrual dysphoric disorder) 05/23/2011    Current Outpatient Medications on File Prior to Visit  Medication Sig Dispense Refill  . Albuterol Sulfate 108 (90 Base) MCG/ACT AEPB Inhale into the lungs.    Marland Kitchen amoxicillin (AMOXIL) 875 MG tablet     . citalopram (CELEXA) 20 MG tablet Take 20 mg by mouth daily.    . fexofenadine (ALLEGRA) 180 MG tablet Take by mouth.    . fluticasone (FLONASE) 50 MCG/ACT nasal spray Place into the nose.    Marland Kitchen HYDROcodone-homatropine (HYCODAN) 5-1.5 MG/5ML syrup     . levonorgestrel-ethinyl estradiol (AVIANE,ALESSE,LESSINA) 0.1-20 MG-MCG tablet     . methylPREDNISolone (MEDROL DOSEPAK) 4 MG TBPK tablet Take as instructed 21 tablet 0  . MICROGESTIN FE 1.5/30 1.5-30 MG-MCG tablet     . norethindrone-ethinyl estradiol (NECON,BREVICON,MODICON) 0.5-35 MG-MCG tablet Take 1 tablet by mouth daily.    . norgestimate-ethinyl estradiol (SPRINTEC 28) 0.25-35 MG-MCG tablet Take 1 tablet by mouth daily. 1 Package 11  . temazepam (RESTORIL) 15  MG capsule Take 15 mg by mouth at bedtime as needed.    . zolpidem (AMBIEN) 10 MG tablet Take 10 mg by mouth at bedtime as needed.     Current Facility-Administered Medications on File Prior to Visit  Medication Dose Route Frequency Provider Last Rate Last Dose  . triamcinolone acetonide (KENALOG) 10 MG/ML injection 10 mg  10 mg Other Once Landis Martins, DPM      . triamcinolone acetonide (KENALOG) 10 MG/ML injection 10 mg  10 mg Other Once [place], Kaitland Lewellyn, DPM      . triamcinolone acetonide (KENALOG-40) injection 20 mg  20 mg Other Once [place], Laban Orourke, DPM      . triamcinolone acetonide (KENALOG-40) injection 20 mg  20 mg Other Once Landis Martins, DPM        Allergies  Allergen Reactions  . Floxin [Ofloxacin] Itching and Rash  . Shellfish Allergy Itching and Shortness Of Breath  . Shellfish-Derived Products     Objective:   General:  Alert and oriented x 3, in no acute distress  Dermatology: Skin is warm, dry, and supple bilateral. Nails are within normal limits however at left first toe nail at the proximal nail fold there is dried blood that appears stable with no loosening of the nail likely secondary to increased mechanical pressure. There is no lower extremity erythema, no eccymosis, no open lesions present bilateral.   Vascular: Dorsalis Pedis and Posterior Tibial pedal  pulses are 2/4 bilateral. + hair growth noted bilateral. Capillary Fill Time is 3 seconds in all digits. No varicosities, No edema bilateral lower extremities.   Neurological: Sensation grossly intact to light touch with an achilles reflex of +2 and a negative Tinel's sign bilateral. Vibratory, sharp/dull, Semmes Weinstein Monofilament within normal limits.   Musculoskeletal: There is tenderness to palpation at the medial calcaneal tubercale and through the insertion of the plantar fascia on the Left<right foot. No pain with compression to calcaneus or application of tuning fork. There is decreased Ankle joint  range of motion bilateral. All other joints range of motion  within normal limits bilateral. Strength 5/5 bilateral. Pes planus foot type bilateral.  Assessment and Plan: Problem List Items Addressed This Visit    None    Visit Diagnoses    Inflammatory heel pain, unspecified laterality    -  Primary   Relevant Orders   CBC with Differential   Basic Metabolic Panel   [id number] Antigen   ANA   C-reactive protein   Sedimentation Rate   Rheumatoid factor   Plantar fasciitis       Relevant Medications   diclofenac (VOLTAREN) 75 MG EC tablet   Pain in both feet       Relevant Medications   diclofenac (VOLTAREN) 75 MG EC tablet   Hematoma       Left 1st toe nail, stable   Foot pain, bilateral         -Complete examination performed.  -Previous x-rays reviewed. -Re-Discussed with patient in detail the condition of plantar fasciitis, how this occurs related to the foot type of the patient and general treatment options.. - Patient opted for another injection today; After oral consent and aseptic prep, injected a mixture containing 1 ml of 1%plain lidocaine, 1 ml 0.5% plain marcaine, 0.5 ml of kenalog 10 and 0.5 ml of dexmethasone phosphate to left<right heel at area of most pain/trigger point injection. -Applied plantar fascial strapping bilateral -Refilled diclofenac -Continue with night splint, stretching, icing, good supportive shoes, inserts daily. -Ordered inflammatory panel for further evaluation for possible other underlying cause of continued inflammatory heel pain We will closely monitor left hallux nail and advised patient that the hematoma or blood underneath the nail will slowly grow out over time- -Patient to return to office after blood work or sooner if problems or questions arise. I will call her with the results of her inflammatory panel once made available.  Landis Martins, DPM

## 2016-12-25 LAB — CBC WITH DIFFERENTIAL/PLATELET
Basophils Absolute: 0 10*3/uL (ref 0.0–0.2)
Basos: 1 %
EOS (ABSOLUTE): 0.1 10*3/uL (ref 0.0–0.4)
EOS: 2 %
HEMATOCRIT: 39.9 % (ref 34.0–46.6)
HEMOGLOBIN: 13.5 g/dL (ref 11.1–15.9)
Immature Grans (Abs): 0 10*3/uL (ref 0.0–0.1)
Immature Granulocytes: 0 %
LYMPHS ABS: 1.7 10*3/uL (ref 0.7–3.1)
Lymphs: 33 %
MCH: 30.2 pg (ref 26.6–33.0)
MCHC: 33.8 g/dL (ref 31.5–35.7)
MCV: 89 fL (ref 79–97)
MONOCYTES: 10 %
Monocytes Absolute: 0.5 10*3/uL (ref 0.1–0.9)
NEUTROS ABS: 2.8 10*3/uL (ref 1.4–7.0)
Neutrophils: 54 %
Platelets: 275 10*3/uL (ref 150–379)
RBC: 4.47 x10E6/uL (ref 3.77–5.28)
RDW: 13.5 % (ref 12.3–15.4)
WBC: 5.2 10*3/uL (ref 3.4–10.8)

## 2016-12-25 LAB — SEDIMENTATION RATE: Sed Rate: 6 mm/hr (ref 0–40)

## 2016-12-25 LAB — BASIC METABOLIC PANEL
BUN/Creatinine Ratio: 20 (ref 9–23)
BUN: 19 mg/dL (ref 6–24)
CO2: 26 mmol/L (ref 20–29)
CREATININE: 0.97 mg/dL (ref 0.57–1.00)
Calcium: 8.7 mg/dL (ref 8.7–10.2)
Chloride: 104 mmol/L (ref 96–106)
GFR calc Af Amer: 78 mL/min/{1.73_m2} (ref >59)
GFR calc non Af Amer: 68 mL/min/{1.73_m2} (ref >59)
Glucose: 101 mg/dL — ABNORMAL HIGH (ref 65–99)
Potassium: 4.5 mmol/L (ref 3.5–5.2)
SODIUM: 140 mmol/L (ref 134–144)

## 2016-12-25 LAB — HLA-B27 ANTIGEN: HLA B27: NEGATIVE

## 2016-12-25 LAB — RHEUMATOID FACTOR: Rhuematoid fact SerPl-aCnc: <10 IU/mL (ref 0.0–13.9)

## 2016-12-25 LAB — C-REACTIVE PROTEIN: CRP: 6 mg/L — ABNORMAL HIGH (ref 0.0–4.9)

## 2017-01-14 ENCOUNTER — Telehealth: Payer: Self-pay | Admitting: *Deleted

## 2017-01-14 DIAGNOSIS — M79673 Pain in unspecified foot: Secondary | ICD-10-CM

## 2017-01-14 NOTE — Telephone Encounter (Signed)
Reorder an ANA for her and mail her the requistion to have it completed at the lab of her choice. And we will call her once I get that result Thanks Dr. Chauncey Cruel

## 2017-01-14 NOTE — Telephone Encounter (Signed)
Patient called Kristy Fernandez returning [facility] phone call.  Patient had lab work done at [facility] not [facility]

## 2017-01-14 NOTE — Telephone Encounter (Signed)
-----   Message from Kristy Fernandez, [place] sent at 01/09/2017 12:04 PM EST ----- Val Please let patient know that I am waiting on the ANA results. The only + was elevated CRP which is a nonspecific marker for inflammation which is likely from her plantar fasciitis No + systemic condition like RA, Lupus or anything like that showing up on her blood work this far -Dr Chauncey Cruel

## 2017-01-14 NOTE — Telephone Encounter (Signed)
I spoke with [place] - [facility] and pt's lab 12/13/2016 no results. I left message of pt to contact me concerning results of 12/20/2016, and will ask pt if she had labs drawn on 12/13/2016.

## 2017-01-15 NOTE — Telephone Encounter (Signed)
Left message informing pt I had reordered the ANA for Dr. Cannon Kettle and she could have it drawn in the [facility] in [place], I would send copy of the lab to her although it would be in [facility] Corp's computer.

## 2017-04-25 IMAGING — CR DG CHEST 2V
2 series · 2 of 2 positions shown · non-contrast
Comparison: None.

CLINICAL DATA: Cough for 2 months

EXAM:
CHEST  2 VIEW

[view not recorded (1 of 2)]
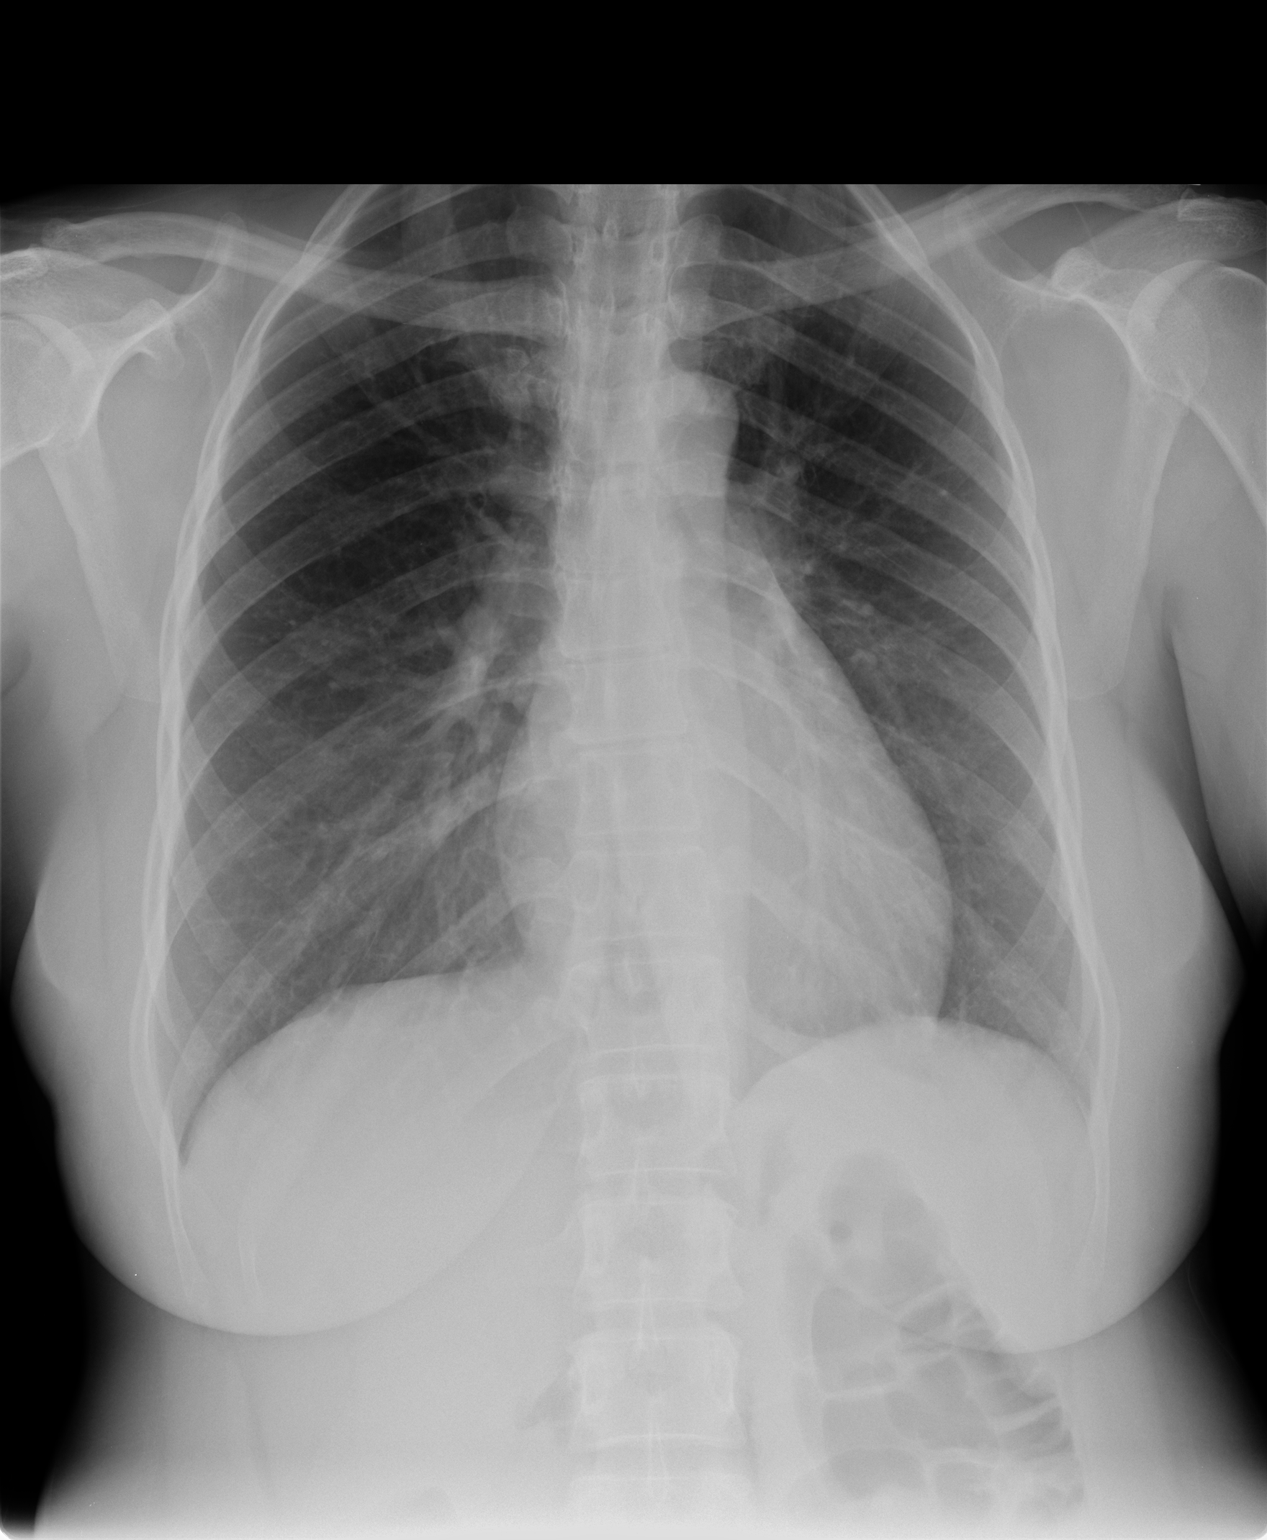

[view not recorded (2 of 2)]
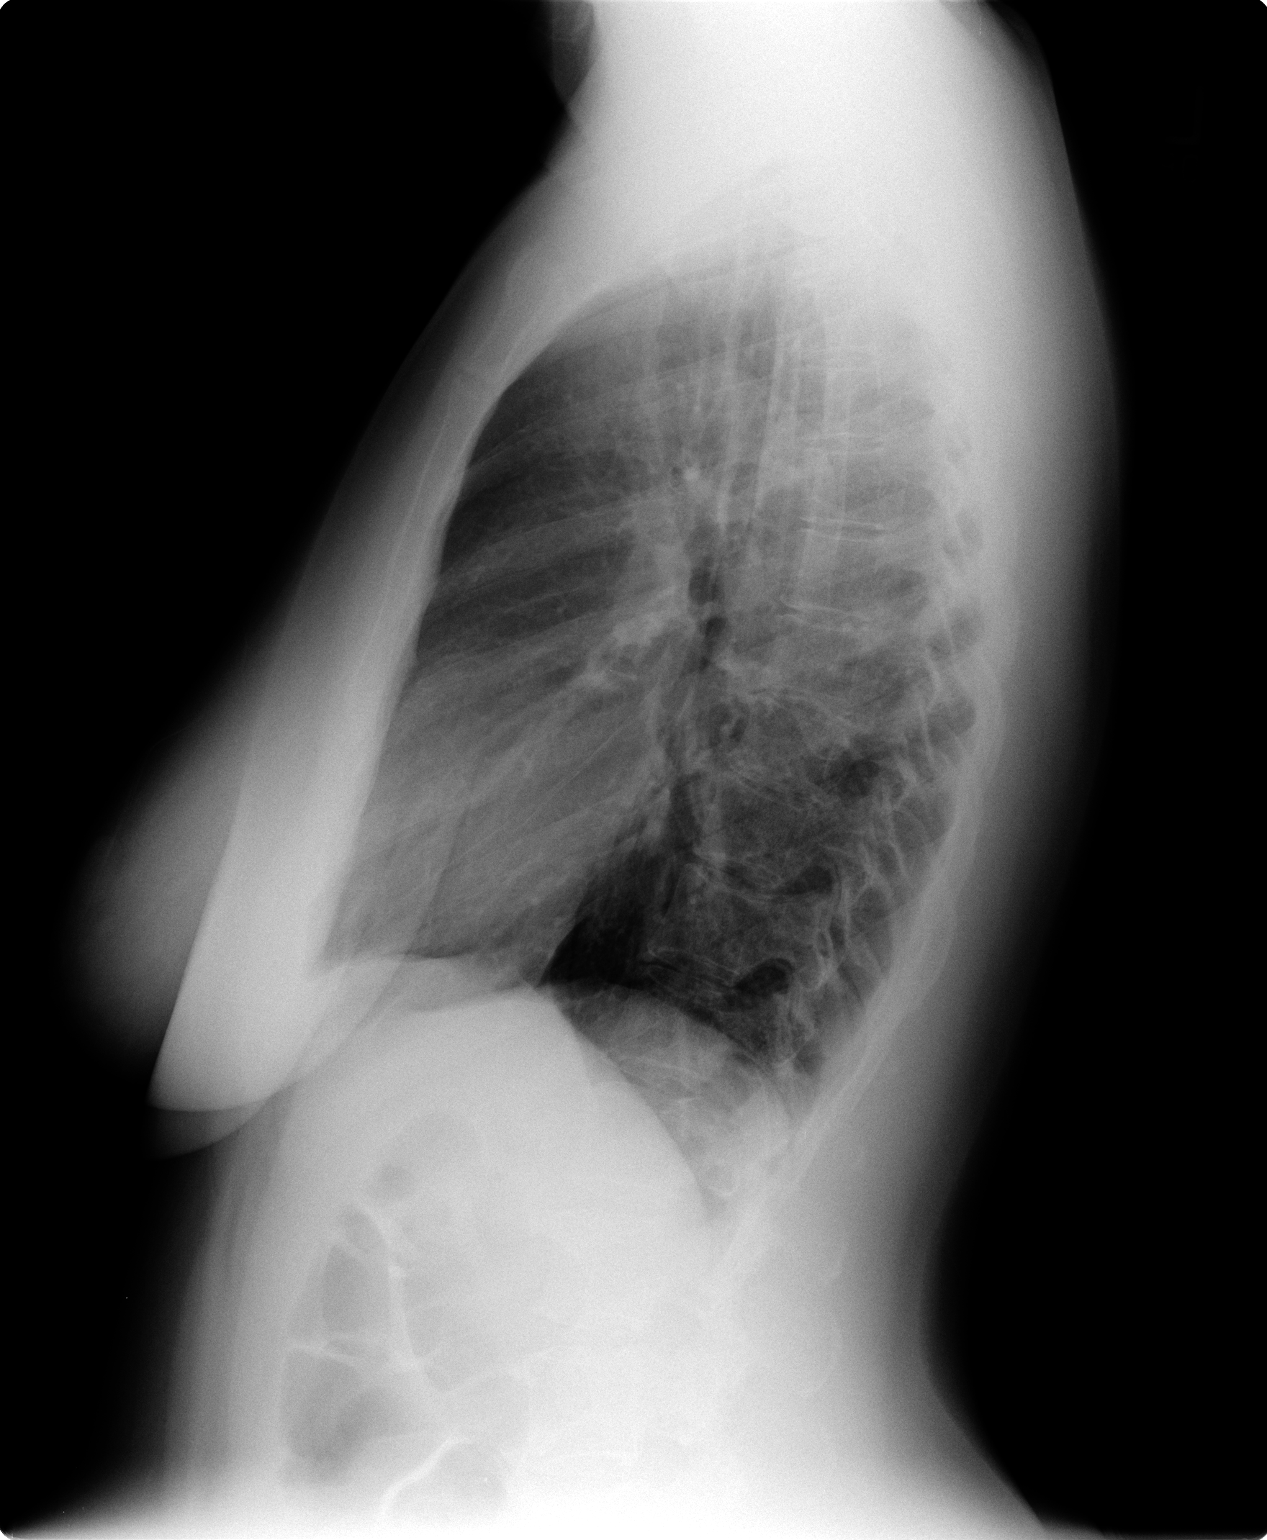

[2 of 2 positions shown; findings below may reference images not displayed]

FINDINGS: Cardiomediastinal silhouette is unremarkable. No acute infiltrate or
pleural effusion. No pulmonary edema. Bony thorax is unremarkable.
IMPRESSION: No active cardiopulmonary disease.

## 2017-06-12 ENCOUNTER — Encounter: Payer: Self-pay | Admitting: Podiatry

## 2017-06-12 ENCOUNTER — Ambulatory Visit (INDEPENDENT_AMBULATORY_CARE_PROVIDER_SITE_OTHER): Payer: 59

## 2017-06-12 ENCOUNTER — Ambulatory Visit (INDEPENDENT_AMBULATORY_CARE_PROVIDER_SITE_OTHER): Payer: 59 | Admitting: Podiatry

## 2017-06-12 DIAGNOSIS — D259 Leiomyoma of uterus, unspecified: Secondary | ICD-10-CM | POA: Insufficient documentation

## 2017-06-12 DIAGNOSIS — M722 Plantar fascial fibromatosis: Secondary | ICD-10-CM

## 2017-06-12 DIAGNOSIS — N912 Amenorrhea, unspecified: Secondary | ICD-10-CM | POA: Insufficient documentation

## 2017-06-12 MED ORDER — TRIAMCINOLONE ACETONIDE 10 MG/ML IJ SUSP
10.0000 mg | Freq: Once | INTRAMUSCULAR | Status: AC
  Administered 2017-06-12: 10 mg

## 2017-06-12 MED ORDER — MELOXICAM 15 MG PO TABS: 15.0000 mg | ORAL_TABLET | Freq: Every day | ORAL | 2 refills | Status: DC

## 2017-06-12 NOTE — Patient Instructions (Signed)

## 2017-06-13 ENCOUNTER — Ambulatory Visit: Payer: 59 | Admitting: Sports Medicine

## 2017-06-15 NOTE — Progress Notes (Signed)
Subjective:   Patient ID: Kristy Fernandez, female   DOB: 52 y.o.   MRN: [id number]   HPI Patient presents stating that her heels have started to hurt her again after taking spinning classes   ROS      Objective:  Physical Exam  Neurovascular status intact negative Homans sign noted no outward indications of diabetes with inflammation fluid in the medial band of the plantar fascial bilateral     Assessment:  Appears to be chronic plantar fasciitis bilateral     Plan:  I went ahead today injected the plantar fascial bilateral 3 mg Kenalog 5 mg Xylocaine and I gave instructions for physical therapy.  Placed on Mobic 15 mg daily and reappoint when symptoms occur again and may require other treatments depending on response  X-rays indicate that there is small spur formation with no indications of stress fracture or advanced arthritis

## 2017-06-16 ENCOUNTER — Ambulatory Visit: Payer: 59 | Admitting: Podiatry

## 2017-11-10 ENCOUNTER — Ambulatory Visit (INDEPENDENT_AMBULATORY_CARE_PROVIDER_SITE_OTHER): Payer: 59 | Admitting: Otolaryngology

## 2017-12-08 ENCOUNTER — Ambulatory Visit (INDEPENDENT_AMBULATORY_CARE_PROVIDER_SITE_OTHER): Payer: 59 | Admitting: Otolaryngology

## 2018-01-21 ENCOUNTER — Ambulatory Visit (INDEPENDENT_AMBULATORY_CARE_PROVIDER_SITE_OTHER): Payer: 59

## 2018-01-21 ENCOUNTER — Other Ambulatory Visit: Payer: Self-pay | Admitting: Podiatry

## 2018-01-21 ENCOUNTER — Ambulatory Visit (INDEPENDENT_AMBULATORY_CARE_PROVIDER_SITE_OTHER): Payer: 59 | Admitting: Podiatry

## 2018-01-21 ENCOUNTER — Encounter: Payer: Self-pay | Admitting: Podiatry

## 2018-01-21 DIAGNOSIS — M722 Plantar fascial fibromatosis: Secondary | ICD-10-CM

## 2018-01-21 DIAGNOSIS — B351 Tinea unguium: Secondary | ICD-10-CM

## 2018-01-21 DIAGNOSIS — M79672 Pain in left foot: Secondary | ICD-10-CM

## 2018-01-21 LAB — HEPATIC FUNCTION PANEL
AG Ratio: 1.3 (calc) (ref 1.0–2.5)
ALBUMIN MSPROF: 3.7 g/dL (ref 3.6–5.1)
ALT: 16 U/L (ref 6–29)
AST: 20 U/L (ref 10–35)
Alkaline phosphatase (APISO): 52 U/L (ref 33–130)
Bilirubin, Direct: 0.1 mg/dL (ref 0.0–0.2)
GLOBULIN: 2.8 g/dL (ref 1.9–3.7)
Indirect Bilirubin: 0.4 mg/dL (calc) (ref 0.2–1.2)
Total Bilirubin: 0.5 mg/dL (ref 0.2–1.2)
Total Protein: 6.5 g/dL (ref 6.1–8.1)

## 2018-01-21 MED ORDER — TRIAMCINOLONE ACETONIDE 10 MG/ML IJ SUSP
10.0000 mg | Freq: Once | INTRAMUSCULAR | Status: AC
  Administered 2018-01-21: 10 mg

## 2018-01-21 MED ORDER — TERBINAFINE HCL 250 MG PO TABS: 250.0000 mg | ORAL_TABLET | Freq: Every day | ORAL | 0 refills | Status: DC

## 2018-01-22 NOTE — Progress Notes (Signed)
Subjective:   Patient ID: Newman Nip, female   DOB: 53 y.o.   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  For the nails I did discuss this in great detail and I recommended antifungal Lamisil treatment and explained risk and did order liver function studies and also recommended laser which she is going to decide on.  She will be seen back for routine visit and was encouraged to call with questions concerns

## 2018-05-01 DIAGNOSIS — F321 Major depressive disorder, single episode, moderate: Secondary | ICD-10-CM | POA: Insufficient documentation

## 2018-05-01 DIAGNOSIS — I10 Essential (primary) hypertension: Secondary | ICD-10-CM | POA: Insufficient documentation

## 2018-06-24 ENCOUNTER — Encounter: Payer: Self-pay | Admitting: Podiatry

## 2018-06-24 ENCOUNTER — Ambulatory Visit (INDEPENDENT_AMBULATORY_CARE_PROVIDER_SITE_OTHER): Payer: 59 | Admitting: Podiatry

## 2018-06-24 ENCOUNTER — Other Ambulatory Visit: Payer: Self-pay

## 2018-06-24 VITALS — Temp 98.0°F

## 2018-06-24 DIAGNOSIS — M722 Plantar fascial fibromatosis: Secondary | ICD-10-CM

## 2018-06-24 DIAGNOSIS — B351 Tinea unguium: Secondary | ICD-10-CM

## 2018-06-24 NOTE — Progress Notes (Signed)
Subjective:   Patient ID: Kristy Fernandez, female   DOB: 53 y.o.   MRN: [id number]   HPI Patient states her heels have been very sore again and her nails seem to be improved but she has questions concerning   ROS      Objective:  Physical Exam  Neurovascular status intact with patient found to have exquisite discomfort plantar heel bilateral with inflammation fluid of the medial band and also is noted to have discoloration of her nails that improved but is still present to a moderate degree F2 acute plantar fasciitis bilateral along with moderate mycotic nail infection that improved with medication     Assessment:  Discussed above     Plan:  H&P condition reviewed and today I injected the plantar heel bilateral 3 mg Kenalog 5 mg Xylocaine and I discussed possible pulse therapy for antifungal treatment bilateral.  Patient will be seen back to recheck

## 2018-07-02 ENCOUNTER — Other Ambulatory Visit: Payer: Self-pay | Admitting: Podiatry

## 2018-07-02 ENCOUNTER — Telehealth: Payer: Self-pay | Admitting: Podiatry

## 2018-07-02 MED ORDER — DICLOFENAC SODIUM 75 MG PO TBEC: 75.0000 mg | DELAYED_RELEASE_TABLET | Freq: Two times a day (BID) | ORAL | 2 refills | Status: DC

## 2018-07-02 NOTE — Telephone Encounter (Signed)
Patient was suppose to receive an anti-inflammatory medication on 06/24/2018 when she seen Dr. Paulla Dolly. It was not sent to the pharmacy. Please call patient

## 2018-08-10 DIAGNOSIS — M79676 Pain in unspecified toe(s): Secondary | ICD-10-CM

## 2018-08-12 ENCOUNTER — Ambulatory Visit (INDEPENDENT_AMBULATORY_CARE_PROVIDER_SITE_OTHER): Payer: 59 | Admitting: Podiatry

## 2018-08-12 ENCOUNTER — Encounter: Payer: Self-pay | Admitting: Podiatry

## 2018-08-12 ENCOUNTER — Other Ambulatory Visit: Payer: Self-pay

## 2018-08-12 VITALS — Temp 97.4°F

## 2018-08-12 DIAGNOSIS — M722 Plantar fascial fibromatosis: Secondary | ICD-10-CM

## 2018-08-12 DIAGNOSIS — B351 Tinea unguium: Secondary | ICD-10-CM

## 2018-08-12 NOTE — Progress Notes (Signed)
Subjective:   Patient ID: Kristy Fernandez, female   DOB: 53 y.o.   MRN: [id number]   HPI Patient presents with severe heel pain right over left stating the injections only helped for short period time and it seems like she is not getting as much relief as she used to it   ROS      Objective:  Physical Exam  Neurovascular status intact with patient found to have exquisite discomfort plantar aspect heel right at the insertional point tendon into the calcaneus with moderate on the left with inflammation also noted.     Assessment:  Chronic plantar fasciitis that is now been present for around 2 years and is increasingly not doing as well with patient wearing orthotics that are not helping her currently     Plan:  Reviewed condition at great length due to the intensity of discomfort failure to respond conservatively and the fact that it is getting closer and closer as far as the medication I did go ahead and recommend surgical endoscopic release of the right fascia and injection left.  I allowed her to go over consent form going over the procedure will be required and spent a great deal time going over complications alternative treatments.  Patient is opted for surgery needs to coordinate with her work and is scheduled for outpatient surgery and signed consent form and is dispensed air fracture walker with all instructions on usage.  Understands total recovery takes approximately 6 months and is encouraged to call with questions prior to procedure

## 2018-08-12 NOTE — Patient Instructions (Signed)
Pre-Operative Instructions  Congratulations, you have decided to take an important step towards improving your quality of life.  You can be assured that the doctors and staff at [facility] will be with you every step of the way.  Here are some important things you should know:  1. Plan to be at the surgery center/hospital at least 1 (one) hour prior to your scheduled time, unless otherwise directed by the surgical center/hospital staff.  You must have a responsible adult accompany you, remain during the surgery and drive you home.  Make sure you have directions to the surgical center/hospital to ensure you arrive on time. 2. If you are having surgery at [facility] or [facility], you will need a copy of your medical history and physical form from your family physician within one month prior to the date of surgery. We will give you a form for your primary physician to complete.  3. We make every effort to accommodate the date you request for surgery.  However, there are times where surgery dates or times have to be moved.  We will contact you as soon as possible if a change in schedule is required.   4. No aspirin/ibuprofen for one week before surgery.  If you are on aspirin, any non-steroidal anti-inflammatory medications (Mobic, Aleve, Ibuprofen) should not be taken seven (7) days prior to your surgery.  You make take Tylenol for pain prior to surgery.  5. Medications - If you are taking daily heart and blood pressure medications, seizure, reflux, allergy, asthma, anxiety, pain or diabetes medications, make sure you notify the surgery center/hospital before the day of surgery so they can tell you which medications you should take or avoid the day of surgery. 6. No food or drink after midnight the night before surgery unless directed otherwise by surgical center/hospital staff. 7. No alcoholic beverages 24-hours prior to surgery.  No smoking 24-hours prior or 24-hours after  surgery. 8. Wear loose pants or shorts. They should be loose enough to fit over bandages, boots, and casts. 9. Don't wear slip-on shoes. Sneakers are preferred. 10. Bring your boot with you to the surgery center/hospital.  Also bring crutches or a walker if your physician has prescribed it for you.  If you do not have this equipment, it will be provided for you after surgery. 11. If you have not been contacted by the surgery center/hospital by the day before your surgery, call to confirm the date and time of your surgery. 12. Leave-time from work may vary depending on the type of surgery you have.  Appropriate arrangements should be made prior to surgery with your employer. 13. Prescriptions will be provided immediately following surgery by your doctor.  Fill these as soon as possible after surgery and take the medication as directed. Pain medications will not be refilled on weekends and must be approved by the doctor. 14. Remove nail polish on the operative foot and avoid getting pedicures prior to surgery. 15. Wash the night before surgery.  The night before surgery wash the foot and leg well with water and the antibacterial soap provided. Be sure to pay special attention to beneath the toenails and in between the toes.  Wash for at least three (3) minutes. Rinse thoroughly with water and dry well with a towel.  Perform this wash unless told not to do so by your physician.  Enclosed: 1 Ice pack (please put in freezer the night before surgery)   1 Hibiclens skin cleaner     Pre-op instructions  If you have any questions regarding the instructions, please do not hesitate to call our office.  [place]: [address], [place], [place] [address] -- [phone]  [place]: [address] [place], [place] [address] -- [phone]  [place]: [address]  [place], [place] [address] -- [phone]  [place]: [address], [address], [place], [place] [address] -- [phone]  Website:  [email or web address] 

## 2019-06-22 ENCOUNTER — Ambulatory Visit (INDEPENDENT_AMBULATORY_CARE_PROVIDER_SITE_OTHER): Payer: 59

## 2019-06-22 ENCOUNTER — Other Ambulatory Visit: Payer: Self-pay

## 2019-06-22 ENCOUNTER — Encounter: Payer: Self-pay | Admitting: Sports Medicine

## 2019-06-22 ENCOUNTER — Ambulatory Visit (INDEPENDENT_AMBULATORY_CARE_PROVIDER_SITE_OTHER): Payer: 59 | Admitting: Sports Medicine

## 2019-06-22 DIAGNOSIS — M722 Plantar fascial fibromatosis: Secondary | ICD-10-CM | POA: Diagnosis not present

## 2019-06-22 DIAGNOSIS — M779 Enthesopathy, unspecified: Secondary | ICD-10-CM | POA: Diagnosis not present

## 2019-06-22 DIAGNOSIS — M79671 Pain in right foot: Secondary | ICD-10-CM | POA: Diagnosis not present

## 2019-06-22 DIAGNOSIS — M79672 Pain in left foot: Secondary | ICD-10-CM

## 2019-06-22 DIAGNOSIS — M792 Neuralgia and neuritis, unspecified: Secondary | ICD-10-CM

## 2019-06-22 NOTE — Progress Notes (Signed)
Subjective: Kristy Fernandez is a 54 y.o. female patient who presents to office for evaluation of right>left foot pain. Patient complains of progressive pain especially over the last 3 weeks that is now doing better after stopping spin class. Gets sharp pain at the top of both feet occasionally now easing off but wanted to have her feet checked. Also reports that she still has pain at heels that is better after changing jobs. Voltaren helped. No other issues noted.   Patient Active Problem List   Diagnosis Date Noted  . Current moderate episode of major depressive disorder without prior episode ([place]) 05/01/2018  . Essential hypertension 05/01/2018  . Amenorrhea 06/12/2017  . Uterine leiomyoma 06/12/2017  . 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 History of HPV infection   . Depression   . Observed sleep apnea   . Yeast infection 05/23/2011  . PMS (premenstrual syndrome) 05/23/2011  . PMDD (premenstrual dysphoric disorder) 05/23/2011  . Abnormal cervical Papanicolaou smear 05/23/2005    Current Outpatient Medications on File Prior to Visit  Medication Sig Dispense Refill  . BIJUVA 1-100 MG CAPS Take 1 capsule by mouth at bedtime.    . citalopram (CELEXA) 20 MG tablet Take 20 mg by mouth daily.    . cyclobenzaprine (FLEXERIL) 10 MG tablet Take by mouth.    . diclofenac (VOLTAREN) 75 MG EC tablet Take 1 tablet (75 mg total) by mouth 2 (two) times daily. 60 tablet 2  . Phentermine HCl 8 MG TABS Take by mouth.    . temazepam (RESTORIL) 15 MG capsule Take 15 mg by mouth at bedtime as needed.    . zolpidem (AMBIEN) 10 MG tablet Take 10 mg by mouth at bedtime as needed.    . Albuterol Sulfate 108 (90 Base) MCG/ACT AEPB Inhale into the lungs.    . fexofenadine (ALLEGRA) 180 MG tablet Take by mouth.    . fluticasone (FLONASE) 50 MCG/ACT nasal spray Place into the nose.     Current Facility-Administered Medications on File Prior to Visit  Medication Dose Route Frequency  Provider Last Rate Last Admin  . triamcinolone acetonide (KENALOG) 10 MG/ML injection 10 mg  10 mg Other Once Landis Martins, DPM      . triamcinolone acetonide (KENALOG) 10 MG/ML injection 10 mg  10 mg Other Once [place], Edana Aguado, DPM      . triamcinolone acetonide (KENALOG-40) injection 20 mg  20 mg Other Once [place], Maghan Jessee, DPM      . triamcinolone acetonide (KENALOG-40) injection 20 mg  20 mg Other Once Landis Martins, DPM        Allergies  Allergen Reactions  . Floxin [Ofloxacin] Itching and Rash  . Shellfish Allergy Itching and Shortness Of Breath  . Topiramate Itching  . Diclofenac     Pt stated, "causes me to get nasal congestion"  . Shellfish-Derived Products     Objective:  General: Alert and oriented x3 in no acute distress  Dermatology: No open lesions bilateral lower extremities, no webspace macerations, no ecchymosis bilateral, all nails x 10 are well manicured.  Vascular: Dorsalis Pedis and Posterior Tibial pedal pulses palpable, Capillary Fill Time 3 seconds,(+) pedal hair growth bilateral, no edema bilateral lower extremities, Temperature gradient within normal limits.  Neurology: Gross sensation intact via light touch bilateral. Subjective tingling/sharp shooting to tops of both feet, improving.    Musculoskeletal: Mild tenderness with palpation at tops of both feet and to heels bilateral chronic history of plantar fasciitis, Strength within normal  limits in all groups bilateral.   Gait: Mildly antalgic gait  Xrays  Left and right Foot   Impression: Severe plantar heel spurs bilateral. No other acute findings.   Assessment and Plan: Problem List Items Addressed This Visit    None    Visit Diagnoses    Tendonitis    -  Primary   Neuritis       Plantar fasciitis, bilateral       Relevant Orders   DG Foot Complete Right (Completed)   DG Foot Complete Left (Completed)   Foot pain, bilateral          -Complete examination performed -Xrays  reviewed -Discussed treatment options -Recommend continue with gentle stretching for plantar fascia to prevent worsening of symptoms -Recommend continue with PO Voltaren and recommend adding on topical pain cream as well -Advised patient continue with good supportive shoes for foot type -Patient to return to office as needed or sooner if condition worsens.  Landis Martins, DPM

## 2021-09-12 ENCOUNTER — Encounter: Payer: Self-pay | Admitting: Pulmonary Disease

## 2021-09-12 ENCOUNTER — Ambulatory Visit (INDEPENDENT_AMBULATORY_CARE_PROVIDER_SITE_OTHER): Payer: No Typology Code available for payment source | Admitting: Pulmonary Disease

## 2021-09-12 VITALS — BP 114/78 | HR 107 | Ht 65.0 in | Wt 171.0 lb

## 2021-09-12 DIAGNOSIS — G4733 Obstructive sleep apnea (adult) (pediatric): Secondary | ICD-10-CM

## 2021-09-12 DIAGNOSIS — F5102 Adjustment insomnia: Secondary | ICD-10-CM | POA: Diagnosis not present

## 2021-09-12 MED ORDER — ESZOPICLONE 2 MG PO TABS: 2.0000 mg | ORAL_TABLET | Freq: Every evening | ORAL | 1 refills | Status: DC | PRN

## 2021-09-12 NOTE — Patient Instructions (Signed)
We will request for copy of your previous sleep study  Prescription for Lunesta sent into pharmacy for you  Call us if its not working or not working enough, you can always cut back to half a pill as needed or you call us, we can call in a lower dosage  I will see you in about 6 to 8 weeks  Call with any significant concerns

## 2021-09-12 NOTE — Progress Notes (Signed)
Kristy Fernandez    [id number]    April 17, 1965  Primary Care Physician:Pcp, No  Referring Physician: No referring provider defined for this encounter.  Chief complaint:   History of mild obstructive sleep apnea  HPI:  Patient with a past history of mild obstructive sleep apnea Has lost 20 pounds recently  She has not been on a CPAP machine Last study was performed in May 2023 showing mild obstructive sleep apnea -Patient unaware of the actual numbers  She does have a history of PTSD and has been having more symptoms of insomnia lately She is seeing a psychotherapist  Has tried Ambien in the past for sleep -Did develop sleepwalking, not interested in trying Ambien again  Usually tries to go to bed about 12 midnight, takes about an hour to fall asleep About 2 awakenings Final wake up time about 7 AM  Usually feels well rested when she wakes up in the morning May start feeling sleepy about midday  Denies any dryness of her mouth in the mornings No morning headaches Memory is good No night sweats  Brother does have obstructive sleep apnea  Denies any other ongoing health issues  Never smoker Rare alcohol use  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   citalopram (CELEXA) 20 MG tablet Take 20 mg by mouth daily.   cyclobenzaprine (FLEXERIL) 10 MG tablet Take by mouth.   eszopiclone (LUNESTA) 2 MG TABS tablet Take 1 tablet (2 mg total) by mouth at bedtime as needed for sleep. Take immediately before bedtime   Phentermine HCl 8 MG TABS Take by mouth.   Albuterol Sulfate 108 (90 Base) MCG/ACT AEPB Inhale into the lungs.   diclofenac (VOLTAREN) 75 MG EC tablet Take 1 tablet (75 mg total) by mouth 2 (two) times daily. (Patient not taking: Reported on 09/12/2021)   fexofenadine (ALLEGRA) 180 MG tablet Take by mouth.    fluticasone (FLONASE) 50 MCG/ACT nasal spray Place into the nose.   [DISCONTINUED] temazepam (RESTORIL) 15 MG capsule Take 15 mg by mouth at bedtime as needed. (Patient not taking: Reported on 09/12/2021)   [DISCONTINUED] zolpidem (AMBIEN) 10 MG tablet Take 10 mg by mouth at bedtime as needed. (Patient not taking: Reported on 09/12/2021)   Facility-Administered Encounter Medications as of 09/12/2021  Medication   triamcinolone acetonide (KENALOG) 10 MG/ML injection 10 mg   triamcinolone acetonide (KENALOG) 10 MG/ML injection 10 mg   triamcinolone acetonide (KENALOG-40) injection 20 mg   triamcinolone acetonide (KENALOG-40) injection 20 mg    Allergies as of 09/12/2021 - Review Complete 09/12/2021  Allergen Reaction Noted   Floxin [ofloxacin] Itching and Rash 05/23/2011   Shellfish allergy Itching and Shortness Of Breath 10/22/2014   Topiramate Itching 03/02/2019   Diclofenac  06/12/2017   Shellfish-derived products  05/23/2011    Past Medical History:  Diagnosis Date   Depression    History of HPV infection    PMDD (premenstrual dysphoric disorder)    Sleep apnea     Past Surgical History:  Procedure Laterality Date   FOOT SURGERY     WISDOM TOOTH EXTRACTION      Family History  Problem Relation Age of Onset   Diabetes Maternal Grandmother    Colon cancer Maternal Grandmother    Heart disease Maternal Grandmother  CHF   Diabetes Maternal Grandfather    Hypertension Paternal Grandmother    Hypertension Paternal Grandfather    Arthritis Mother    Arthritis Father    Diabetes Father     Social History   Socioeconomic History   Marital status: Single    Spouse name: Not on file   Number of children: Not on file   Years of education: Not on file   Highest education level: Not on file  Occupational History   Not on file  Tobacco Use   Smoking status: Never   Smokeless tobacco: Never  Substance and Sexual Activity   Alcohol use: Yes    Comment: occasional    Drug use: No   Sexual activity: Yes    Partners: Male    Birth control/protection: Condom, Pill    Comment: NECON 0.5/35  Other Topics Concern   Not on file  Social History Narrative   Not on file   Social Determinants of Health   Financial Resource Strain: Not on file  Food Insecurity: Not on file  Transportation Needs: Not on file  Physical Activity: Not on file  Stress: Not on file  Social Connections: Not on file  Intimate Partner Violence: Not on file    Review of Systems  Constitutional:  Negative for fatigue.  Respiratory:  Positive for apnea.   Psychiatric/Behavioral:  Positive for sleep disturbance.     Vitals:   09/12/21 1404  BP: 114/78  Pulse: (!) 107  SpO2: 97%     Physical Exam Constitutional:      Appearance: Normal appearance.  HENT:     Head: Normocephalic.     Mouth/Throat:     Mouth: Mucous membranes are moist.     Comments: Mallampati 3, crowded oropharynx Cardiovascular:     Rate and Rhythm: Normal rate and regular rhythm.     Pulses: Normal pulses.     Heart sounds: No murmur heard.    No friction rub.  Pulmonary:     Effort: No respiratory distress.     Breath sounds: No stridor. No wheezing or rhonchi.  Musculoskeletal:     Cervical back: No rigidity or tenderness.  Neurological:     Mental Status: She is alert.  Psychiatric:        Mood and Affect: Mood normal.       09/12/2021    2:08 PM  Results of the Epworth flowsheet  Sitting and reading 3  Watching TV 3  Sitting, inactive in a public place (e.g. a theatre or a meeting) 0  As a passenger in a car for an hour without a break 0  Lying down to rest in the afternoon when circumstances permit 3  Sitting and talking to someone 0  Sitting quietly after a lunch without alcohol 3  In a car, while stopped for a few minutes in traffic 0  Total score 12     Data Reviewed: Previous sleep study not available for review at present  Assessment:  History of mild obstructive  sleep apnea  Denies significant daytime symptoms at present  Daytime sleepiness likely related to sleep onset and sleep maintenance insomnia and nonrestorative sleep  History of PTSD  Pathophysiology of sleep disordered breathing was discussed with the patient  Treatment options for sleep disordered breathing discussed with the patient  Plan/Recommendations: Trial with Lunesta 2 mg discussed with the patient and ordered  May need to escalate or de-escalate dosing as needed  We will request for a  copy of her sleep study  Tentative follow-up in about 8 to 12 weeks   Sherrilyn Rist MD [facility] 09/12/2021, 2:30 PM  CC: No ref. provider found

## 2021-09-24 ENCOUNTER — Telehealth: Payer: Self-pay | Admitting: Pulmonary Disease

## 2021-09-24 NOTE — Telephone Encounter (Signed)
Sleep study result  Sleep study from [facility] finally received  Reviewed  Performed 05/17/2021 AHI of 8.8 lowest desaturation of 89%

## 2021-11-07 ENCOUNTER — Ambulatory Visit (INDEPENDENT_AMBULATORY_CARE_PROVIDER_SITE_OTHER): Payer: No Typology Code available for payment source | Admitting: Pulmonary Disease

## 2021-11-07 ENCOUNTER — Encounter: Payer: Self-pay | Admitting: Pulmonary Disease

## 2021-11-07 VITALS — BP 118/76 | HR 99 | Ht 65.0 in | Wt 177.0 lb

## 2021-11-07 DIAGNOSIS — G4733 Obstructive sleep apnea (adult) (pediatric): Secondary | ICD-10-CM

## 2021-11-07 MED ORDER — ESZOPICLONE 2 MG PO TABS: 2.0000 mg | ORAL_TABLET | Freq: Every evening | ORAL | 5 refills | Status: AC | PRN

## 2021-11-07 NOTE — Patient Instructions (Signed)
Lunesta refill sent to pharmacy with 5 refills on it  Continue weight management  Call us with any significant concerns  I will see you in 6 months

## 2021-11-07 NOTE — Progress Notes (Signed)
Kristy Fernandez    [id number]    08-Jan-1965  Primary Care Physician:Pcp, No  Referring Physician: No referring provider defined for this encounter.  Chief complaint:   History of mild obstructive sleep apnea  HPI:  Patient with a past history of mild obstructive sleep apnea Has lost 20 pounds recently  She has not been on a CPAP machine -Last study was reviewed showing AHI of 8.8 -Weight is down from 9  History of insomnia, sees a psychotherapist Placed on [place] because Ambien was not working and Johnnye Sima seems to be helping  Usually tries to go to bed about 12 midnight, takes about an hour to fall asleep About 2 awakenings Final wake up time about 7 AM  Usually feels well rested when she wakes up in the morning May start feeling sleepy about midday  Denies any dryness of her mouth in the mornings No morning headaches Memory is good No night sweats  Brother does have obstructive sleep apnea  Denies any other ongoing health issues  Never smoker Rare alcohol use  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   BIJUVA 1-100 MG CAPS Take 1 capsule by mouth at bedtime.   citalopram (CELEXA) 20 MG tablet Take 20 mg by mouth daily.   cyclobenzaprine (FLEXERIL) 10 MG tablet Take by mouth.   diclofenac (VOLTAREN) 75 MG EC tablet Take 1 tablet (75 mg total) by mouth 2 (two) times daily. (Patient not taking: Reported on 09/12/2021)   eszopiclone (LUNESTA) 2 MG TABS tablet Take 1 tablet (2 mg total) by mouth at bedtime as needed for sleep. Take immediately before bedtime   fexofenadine (ALLEGRA) 180 MG tablet Take by mouth.   fluticasone (FLONASE) 50 MCG/ACT nasal spray Place into the nose.   Phentermine HCl 8 MG TABS Take by mouth.   Facility-Administered Encounter Medications as of 11/07/2021  Medication    triamcinolone acetonide (KENALOG) 10 MG/ML injection 10 mg   triamcinolone acetonide (KENALOG) 10 MG/ML injection 10 mg   triamcinolone acetonide (KENALOG-40) injection 20 mg   triamcinolone acetonide (KENALOG-40) injection 20 mg    Allergies as of 11/07/2021 - Review Complete 11/07/2021  Allergen Reaction Noted   Floxin [ofloxacin] Itching and Rash 05/23/2011   Shellfish allergy Itching and Shortness Of Breath 10/22/2014   Shellfish-derived products Shortness Of Breath 05/23/2011   Topiramate Itching 03/02/2019   Diclofenac  06/12/2017    Past Medical History:  Diagnosis Date   Depression    History of HPV infection    PMDD (premenstrual dysphoric disorder)    Sleep apnea     Past Surgical History:  Procedure Laterality Date   FOOT SURGERY     WISDOM TOOTH EXTRACTION      Family History  Problem Relation Age of Onset   Diabetes Maternal Grandmother    Colon cancer Maternal Grandmother    Heart disease Maternal Grandmother        CHF   Diabetes Maternal Grandfather    Hypertension Paternal Grandmother    Hypertension Paternal Grandfather    Arthritis Mother    Arthritis Father    Diabetes Father     Social History   Socioeconomic History   Marital status: Single    Spouse name: Not on file   Number of children: Not on file   Years of  education: Not on file   Highest education level: Not on file  Occupational History   Not on file  Tobacco Use   Smoking status: Never   Smokeless tobacco: Never  Substance and Sexual Activity   Alcohol use: Yes    Comment: occasional   Drug use: No   Sexual activity: Yes    Partners: Male    Birth control/protection: Condom, Pill    Comment: NECON 0.5/35  Other Topics Concern   Not on file  Social History Narrative   Not on file   Social Determinants of Health   Financial Resource Strain: Not on file  Food Insecurity: Not on file  Transportation Needs: Not on file  Physical Activity: Not on file  Stress: Not on  file  Social Connections: Not on file  Intimate Partner Violence: Not on file    Review of Systems  Constitutional:  Negative for fatigue.  Respiratory:  Positive for apnea.   Psychiatric/Behavioral:  Positive for sleep disturbance.     There were no vitals filed for this visit.    Physical Exam Constitutional:      Appearance: Normal appearance.  HENT:     Head: Normocephalic.     Mouth/Throat:     Mouth: Mucous membranes are moist.     Comments: Mallampati 3, crowded oropharynx Cardiovascular:     Rate and Rhythm: Normal rate and regular rhythm.     Pulses: Normal pulses.     Heart sounds: No murmur heard.    No friction rub.  Pulmonary:     Effort: No respiratory distress.     Breath sounds: No stridor. No wheezing or rhonchi.  Musculoskeletal:     Cervical back: No rigidity or tenderness.  Neurological:     Mental Status: She is alert.  Psychiatric:        Mood and Affect: Mood normal.       09/12/2021    2:08 PM  Results of the Epworth flowsheet  Sitting and reading 3  Watching TV 3  Sitting, inactive in a public place (e.g. a theatre or a meeting) 0  As a passenger in a car for an hour without a break 0  Lying down to rest in the afternoon when circumstances permit 3  Sitting and talking to someone 0  Sitting quietly after a lunch without alcohol 3  In a car, while stopped for a few minutes in traffic 0  Total score 12   Data Reviewed: Previous sleep study not available for review at present  Assessment:  History of mild obstructive sleep apnea  Insomnia  History of PTSD  Has been doing relatively well   Plan/Recommendations: Continue Lunesta Refill sent today  Copy of recent sleep study reviewed with her today  Follow-up in 6 months   Sherrilyn Rist MD [facility] 11/07/2021, 2:10 PM  CC: No ref. provider found
